# Patient Record
Sex: Male | Born: 1965
Health system: Southern US, Community
[De-identification: ages and names within clinical notes are randomized; demographics above are authoritative.]

## PROBLEM LIST (undated history)

## (undated) DIAGNOSIS — M109 Gout, unspecified: Secondary | ICD-10-CM

## (undated) HISTORY — PX: RHINOPLASTY: SUR1284

## (undated) HISTORY — DX: Gout, unspecified: M10.9

---

## 2006-04-03 ENCOUNTER — Encounter: Admission: RE | Admit: 2006-04-03 | Discharge: 2006-04-03 | Payer: Self-pay | Admitting: Emergency Medicine

## 2007-09-22 ENCOUNTER — Encounter: Admission: RE | Admit: 2007-09-22 | Discharge: 2007-09-22 | Payer: Self-pay | Admitting: Emergency Medicine

## 2008-12-27 ENCOUNTER — Ambulatory Visit: Payer: Self-pay | Admitting: Surgery

## 2011-03-06 NOTE — Assessment & Plan Note (Signed)
OFFICE VISIT   JOCELYN, LOWERY  DOB:  1966-08-16                                       12/27/2008  BJYNW#:[id number]   REASON FOR EVALUATION:  Rule out Raynaud's phenomenon.   HISTORY:  This is a 45 year old gentleman I am seeing at the request of  Dr. Georgina Pillion for evaluation of bilateral cold feet.  The patient has a  family history of peripheral vascular disease and has been complaining  with increasing severity over the past several months that his feet have  been becoming increasingly cold.  He denies any specific color changes.  He denies symptoms of claudication.  He has not had any ulceration.  There are no aggravating or relieving factors except for ambient  temperature.   The patient has a history of hypercholesterolemia; however, this is  managed without medications.  He has no other significant cardiovascular  history.  He does not smoke.   REVIEW OF SYSTEMS:  GENERAL:  Negative for fevers, chills, weight gain,  weight loss.  CARDIAC:  Negative.  PULMONARY:  Negative.  GI:  Negative.  GU:  Negative.  VASCULAR:  Please see above.  NEURO:  Negative.  ORTHO:  Negative.  PSYCH:  Negative.  ENT:  Negative.  HEME:  Negative.   PAST MEDICAL HISTORY:  1. Hypercholesterolemia.  2. Gout.  3. Adult ADD.  4. Erectile dysfunction.  5. Seasonal allergies.   PAST SURGICAL HISTORY:  1. Rhinoplasty.  2. Tonsillectomy.   SOCIAL HISTORY:  He chews tobacco 1/2 can times 20 years.  Alcohol is  occasional.   ALLERGIES:  None.   MEDICATIONS:  Allopurinol.   PHYSICAL EXAMINATION:  Vital Signs:  Blood pressure 134/80, pulse 60.  General:  He is well-appearing, in no distress.  Cardiovascular:  Regular rate and rhythm, respirations nonlabored.  Extremities:  Warm  and well-perfused.  Pedal pulses are palpable.  Neuro:  Nonfocal.  Psych:  He is alert and oriented.   DIAGNOSTIC STUDIES:  Ankle brachial indices were performed.  This  reveals triphasic  pedal waveforms and ankle brachial indices greater  than 1.   ASSESSMENT/PLAN:  Bilateral cold lower extremity.   Plan:  Patient does not have Raynaud's syndrome.  He has normal ankle  brachial indices.  There is no vascular etiology for his feet being  cold.   The patient does have a history of peripheral vascular disease in his  family and, therefore, we had a discussion of risk factor modification.  We discussed cessation of smokeless tobacco.  We also discussed  improving his cholesterol profile with an aim of getting his LDL levels  to less than 100, preferably less than 70.  He is not on a statin  currently, this could be added in the future should his cholesterol  profile worsen.  Again, I do not see a vascular etiology for his cold  feet.  He has normal arterial circulation in his feet.   Jorge Ny, MD  Electronically Signed   VWB/MEDQ  D:  12/27/2008  T:  12/28/2008  Job:  [id number]   cc:   Oley Balm. Georgina Pillion, M.D.

## 2013-09-24 ENCOUNTER — Other Ambulatory Visit: Payer: Self-pay | Admitting: Gastroenterology

## 2013-09-24 DIAGNOSIS — R195 Other fecal abnormalities: Secondary | ICD-10-CM

## 2013-09-24 DIAGNOSIS — R194 Change in bowel habit: Secondary | ICD-10-CM

## 2013-09-28 ENCOUNTER — Ambulatory Visit
Admission: RE | Admit: 2013-09-28 | Discharge: 2013-09-28 | Disposition: A | Discharge status: Home / Self Care | Payer: PRIVATE HEALTH INSURANCE | Source: Ambulatory Visit | Attending: Gastroenterology | Admitting: Gastroenterology

## 2013-09-28 DIAGNOSIS — R194 Change in bowel habit: Secondary | ICD-10-CM

## 2013-09-28 DIAGNOSIS — R195 Other fecal abnormalities: Secondary | ICD-10-CM

## 2013-09-28 MED ORDER — IOHEXOL 300 MG/ML  SOLN
25.0000 mL | Freq: Once | INTRAMUSCULAR | Status: AC | PRN
  Administered 2013-09-28: 25 mL via ORAL

## 2013-09-28 MED ORDER — IOHEXOL 300 MG/ML  SOLN
125.0000 mL | Freq: Once | INTRAMUSCULAR | Status: AC | PRN
  Administered 2013-09-28: 125 mL via INTRAVENOUS

## 2013-11-16 ENCOUNTER — Encounter (INDEPENDENT_AMBULATORY_CARE_PROVIDER_SITE_OTHER): Payer: Self-pay

## 2013-11-16 ENCOUNTER — Encounter (INDEPENDENT_AMBULATORY_CARE_PROVIDER_SITE_OTHER): Payer: Self-pay | Admitting: General Surgery

## 2013-11-16 ENCOUNTER — Ambulatory Visit (INDEPENDENT_AMBULATORY_CARE_PROVIDER_SITE_OTHER): Payer: 59 | Admitting: General Surgery

## 2013-11-16 VITALS — BP 128/88 | HR 80 | Resp 14 | Ht 68.0 in | Wt 205.8 lb

## 2013-11-16 DIAGNOSIS — K602 Anal fissure, unspecified: Secondary | ICD-10-CM | POA: Insufficient documentation

## 2013-11-16 NOTE — Progress Notes (Signed)
Patient ID: James Saunders, male   DOB: 1966/03/10, 48 y.o.   MRN: [id number]  Chief Complaint  Patient presents with  . perirectal abscess    HPI James Saunders is a 48 y.o. male.   HPI  He is referred by Dr. Loreta AveMann for further evaluation and treatment of a perirectal abscess. He has noted intermittent mucoid drainage from his anus since October. He sometimes feels swellings and then they go down. He denies any pain with bowel movements. He denies anal pain in general. He has some occasional bleeding. He underwent a colonoscopy recently which demonstrated some benign polyps. He denies any trauma to the anal area.  Past Medical History  Diagnosis Date  . Gout     Past Surgical History  Procedure Laterality Date  . Rhinoplasty      Family History  Problem Relation Age of Onset  . Kidney disease Mother     renal failure  . Heart disease Father     CHF  . Cancer Maternal Grandmother     breast cancer    Social History History  Substance Use Topics  . Smoking status: Current Every Day Smoker  . Smokeless tobacco: Not on file  . Alcohol Use: Yes     Comment: 5-6 weekly    Not on File  Current Outpatient Prescriptions  Medication Sig Dispense Refill  . ALLOPURINOL PO Take by mouth.       No current facility-administered medications for this visit.    Review of Systems Review of Systems  Constitutional: Negative.   Respiratory: Negative.   Cardiovascular: Negative.   Gastrointestinal: Positive for anal bleeding.  Genitourinary: Negative.   Musculoskeletal: Positive for arthralgias.    Blood pressure 128/88, pulse 80, resp. rate 14, height 5\' 8"  (1.727 m), weight 205 lb 12.8 oz (93.35 kg).  Physical Exam Physical Exam  Constitutional: No distress.  Overweight male.  HENT:  Head: Normocephalic and atraumatic.  Eyes: No scleral icterus.  Genitourinary:  Small nodular mass left perianal skin. Firm nodular masses posterior that are somewhat tender. Irregularity at the  6:00 position.  Some yellow, mucoid drainage is present.  Anoscopy was performed but it was very uncomfortable for him thus I could not visualize anything well.    Data Reviewed Notes from Dr. Loreta AveMann.  Assessment    His presentation is clinically suspicious for a anal fissure. However, he does not have much pain with bowel movements.     Plan    I recommended an examination under anesthesia and possible biopsy of some of the nodules to try and definitively diagnose the problem. We discussed the procedure the risks. Risks include but limited to bleeding, infection, wound problems, anesthesia. He seems to understand this and agrees with the plan.  He will call us back to schedule surgery.       Jordin Dambrosio J 11/16/2013, 12:00 PM

## 2013-11-16 NOTE — Patient Instructions (Signed)
Call [phone] to schedule your surgery.  Ask for a surgery scheduler.

## 2013-11-19 ENCOUNTER — Encounter (INDEPENDENT_AMBULATORY_CARE_PROVIDER_SITE_OTHER): Payer: Self-pay

## 2013-11-20 ENCOUNTER — Encounter (INDEPENDENT_AMBULATORY_CARE_PROVIDER_SITE_OTHER): Payer: Self-pay | Admitting: General Surgery

## 2013-11-25 ENCOUNTER — Telehealth (INDEPENDENT_AMBULATORY_CARE_PROVIDER_SITE_OTHER): Payer: Self-pay | Admitting: *Deleted

## 2013-11-25 NOTE — Telephone Encounter (Signed)
Patient called to state that he had received a message there were updates on his mychart however when he logged in there were no updates he could find.  I explained that I don't see a recent message or any updates from this end however [facility] Abbey Chattersosenbower to ask if there was something we needed to let him know.  Patient states understanding and agreeable at this time.

## 2013-11-27 ENCOUNTER — Other Ambulatory Visit (INDEPENDENT_AMBULATORY_CARE_PROVIDER_SITE_OTHER): Payer: Self-pay | Admitting: General Surgery

## 2013-11-30 ENCOUNTER — Telehealth (INDEPENDENT_AMBULATORY_CARE_PROVIDER_SITE_OTHER): Payer: Self-pay | Admitting: General Surgery

## 2013-11-30 NOTE — Telephone Encounter (Signed)
Noted  

## 2013-11-30 NOTE — Telephone Encounter (Signed)
Pt made aware of [facility] financial obligation will call back if decides to proceed

## 2015-01-23 IMAGING — CT CT ABD-PELV W/ CM
2 of 5 series · 17 of 46 positions shown, 19 images · IV contrast (RECTAL.READY&H20 & [ID] OMNI 300)
Comparison: None.

CLINICAL DATA: Pus and blood in stool; change in bowel habits

EXAM:
CT ABDOMEN AND PELVIS WITH CONTRAST
TECHNIQUE: Multidetector CT imaging of the abdomen and pelvis was performed
using the standard protocol following bolus administration of
intravenous contrast. Oral and rectal contrast were also
administered.
CONTRAST:  125mL OMNIPAQUE IOHEXOL 300 MG/ML  SOLN

[Series 2: abd/pelvis with · axial · 0.76mm/px · z∈[-532,-82]mm · 14 of 100 slices shown, 16 images]
[im 6/100  soft-tissue]
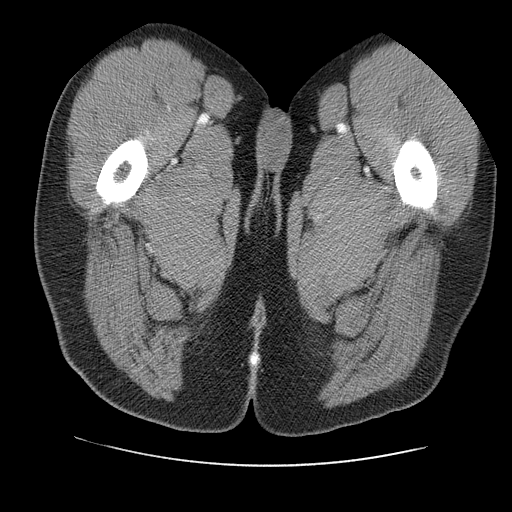
[im 6/100  bone]
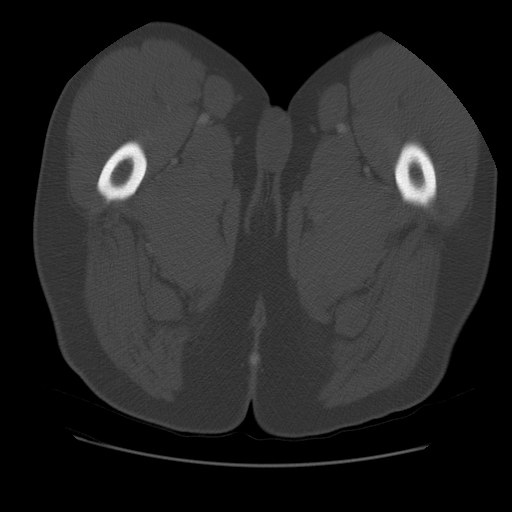
[im 12/100  soft-tissue]
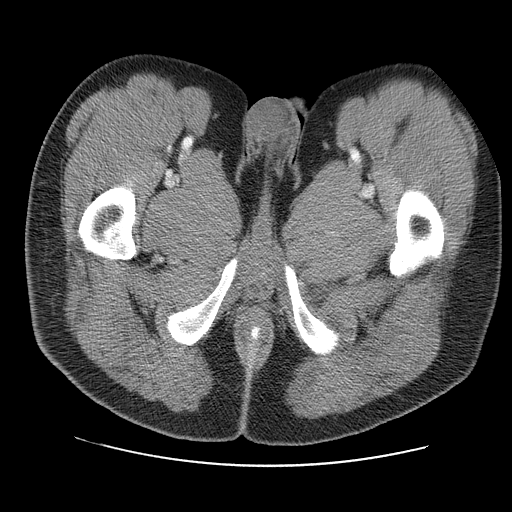
[im 23/100  soft-tissue]
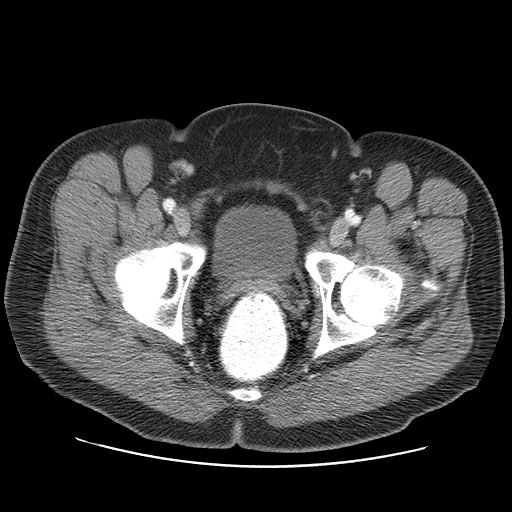
[im 28/100  soft-tissue]
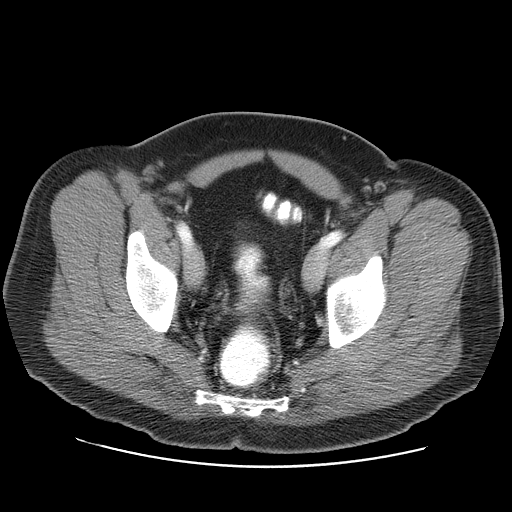
[im 34/100  soft-tissue]
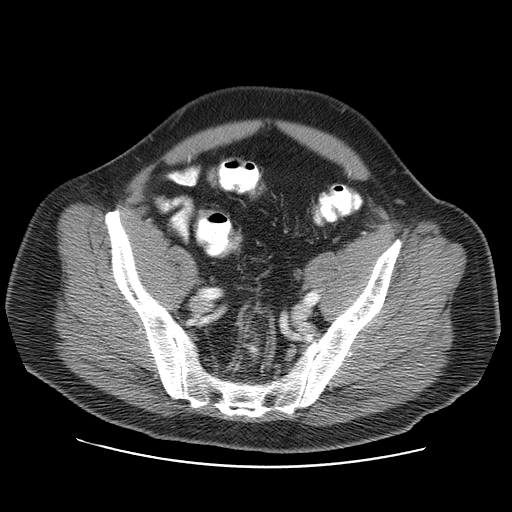
[im 39/100  soft-tissue]
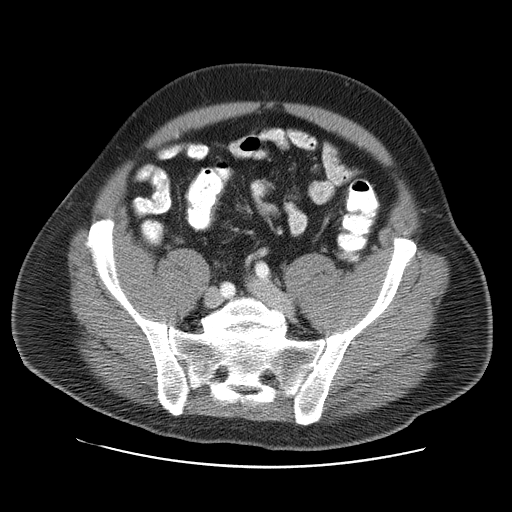
[im 45/100  soft-tissue]
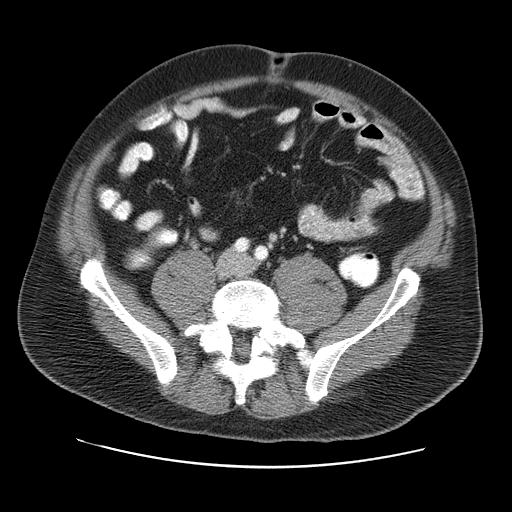
[im 56/100  soft-tissue]
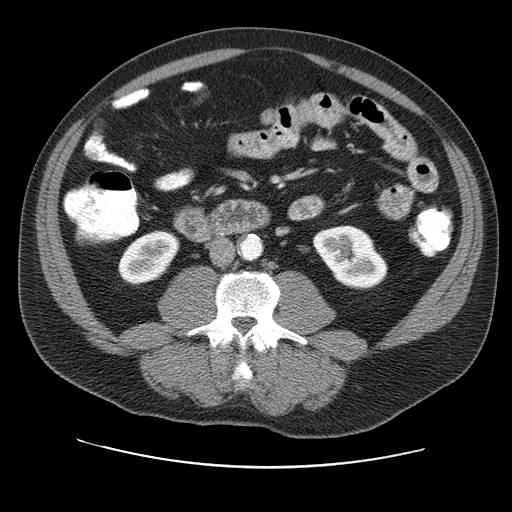
[im 61/100  soft-tissue]
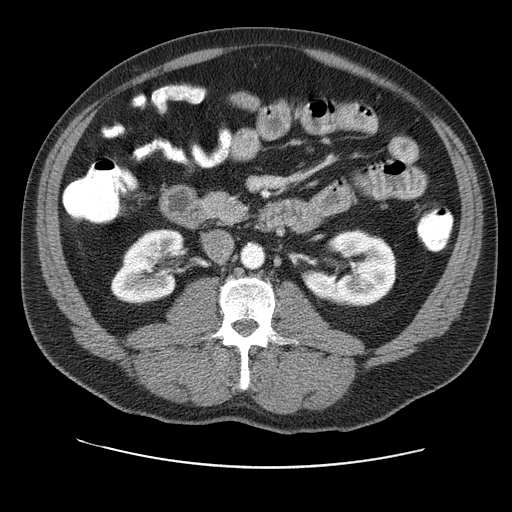
[im 61/100  bone]
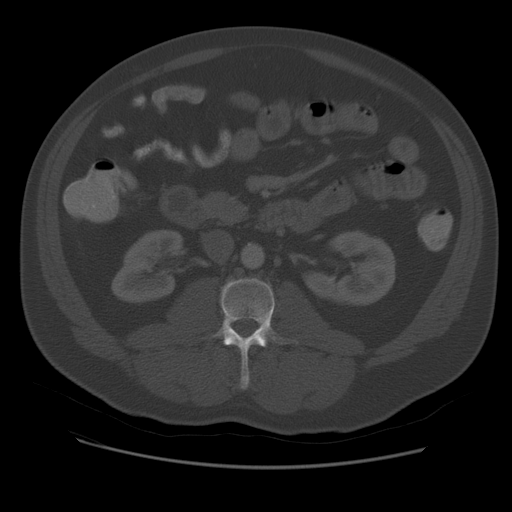
[im 67/100  soft-tissue]
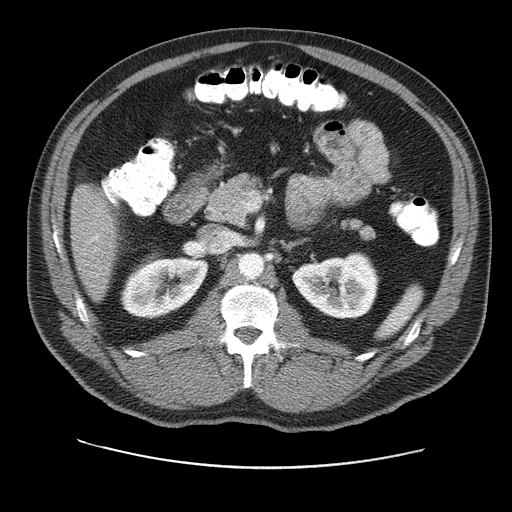
[im 72/100  soft-tissue]
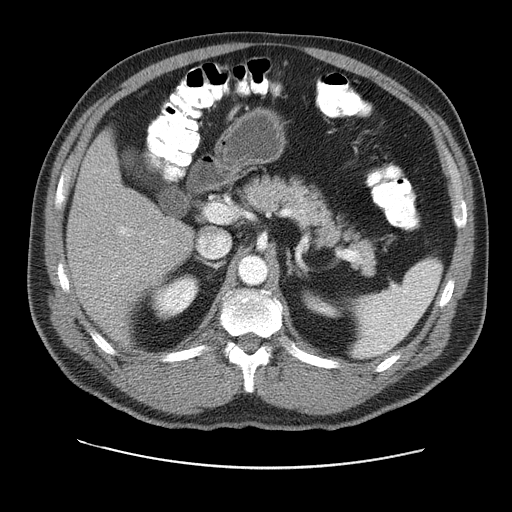
[im 78/100  soft-tissue]
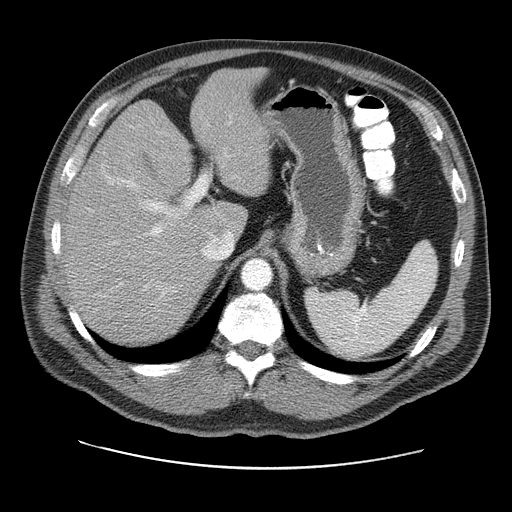
[im 89/100  soft-tissue]
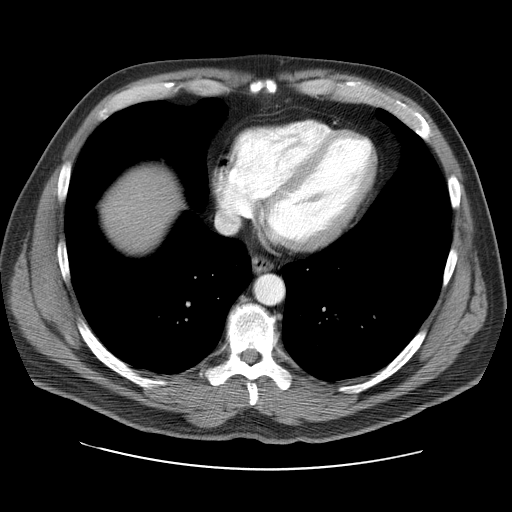
[im 94/100  soft-tissue]
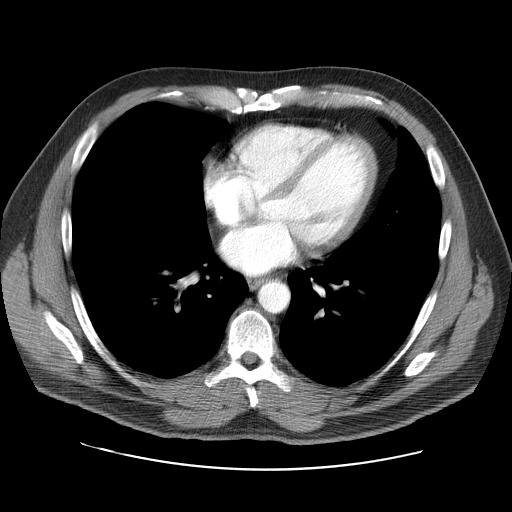

[Series 400: cor · coronal · 1.01mm/px · 3 of 159 slices shown]
[im 53/159  soft-tissue]
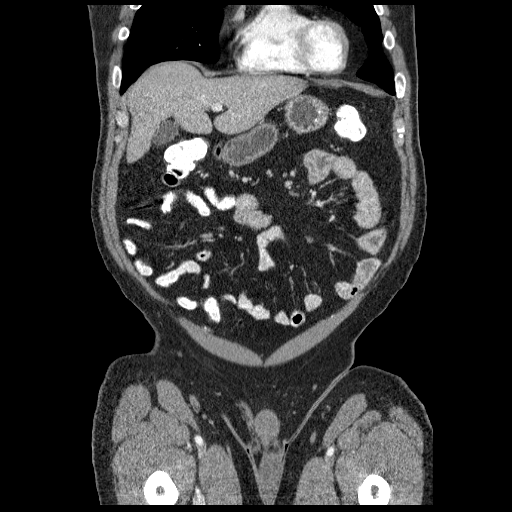
[im 71/159  soft-tissue]
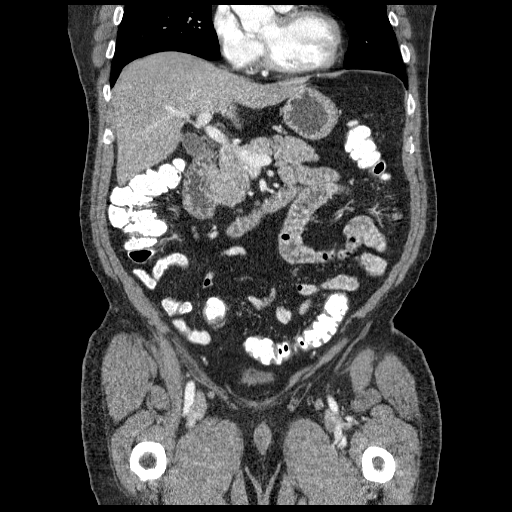
[im 88/159  soft-tissue]
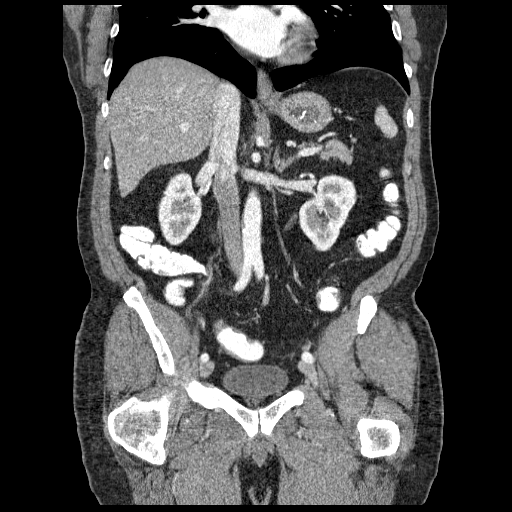

[17 of 46 positions shown; findings below may reference images not displayed]

FINDINGS: Lung bases are clear.

There is fatty change in the liver. No focal liver lesions are
identified. There is no biliary duct dilatation. Gallbladder wall is
not thickened.

Spleen, pancreas, and adrenals appear normal. Kidneys bilaterally
show fetal lobulation but no evidence of mass. There is no calculus
or hydronephrosis in either kidney. There is no apparent ureteral
calculus on either side.

In the pelvis, there is thickening of the wall of the proximal
rectum and much of the sigmoid colon consistent with a degree of
colitis. No abscess seen in this region. There is no evidence of
rectal or anal fistula or surrounding mesenteric thickening. There
is no pelvic mass or fluid collection. Appendix appears normal.

There is no bowel obstruction. No free air or portal venous air.
There is no appreciable ascites, adenopathy, or abscess in the
abdomen or pelvis. There is atherosclerotic change in the aorta but
no aneurysm. There are pars defects at L5 bilaterally with 8 mm of
anterolisthesis of L5 on S1. There is disc narrowing at L4-5 and
L5-S1. There are no blastic or lytic bone lesions.
IMPRESSION: There is thickening of the wall of much of the sigmoid colon and
proximal rectum consistent with a degree of colitis and proximal
proctitis. There is no abscess or fistula. The surrounding mesentery
appears normal.

No bowel obstruction. Elsewhere, no abscess or mesenteric
thickening. Appendix appears normal.

Fatty liver.

## 2018-08-22 DIAGNOSIS — Z23 Encounter for immunization: Secondary | ICD-10-CM | POA: Diagnosis not present

## 2018-08-22 DIAGNOSIS — Z0000 Encounter for general adult medical examination without abnormal findings: Secondary | ICD-10-CM | POA: Diagnosis not present

## 2018-08-22 DIAGNOSIS — Z125 Encounter for screening for malignant neoplasm of prostate: Secondary | ICD-10-CM | POA: Diagnosis not present

## 2018-08-22 DIAGNOSIS — E785 Hyperlipidemia, unspecified: Secondary | ICD-10-CM | POA: Diagnosis not present

## 2018-09-23 DIAGNOSIS — J019 Acute sinusitis, unspecified: Secondary | ICD-10-CM | POA: Diagnosis not present

## 2019-07-07 DIAGNOSIS — D229 Melanocytic nevi, unspecified: Secondary | ICD-10-CM | POA: Diagnosis not present

## 2019-07-07 DIAGNOSIS — L918 Other hypertrophic disorders of the skin: Secondary | ICD-10-CM | POA: Diagnosis not present

## 2019-07-07 DIAGNOSIS — L570 Actinic keratosis: Secondary | ICD-10-CM | POA: Diagnosis not present

## 2019-07-28 DIAGNOSIS — Z23 Encounter for immunization: Secondary | ICD-10-CM | POA: Diagnosis not present

## 2019-09-14 DIAGNOSIS — Z0000 Encounter for general adult medical examination without abnormal findings: Secondary | ICD-10-CM | POA: Diagnosis not present

## 2019-10-07 DIAGNOSIS — E785 Hyperlipidemia, unspecified: Secondary | ICD-10-CM | POA: Diagnosis not present

## 2019-10-07 DIAGNOSIS — Z125 Encounter for screening for malignant neoplasm of prostate: Secondary | ICD-10-CM | POA: Diagnosis not present

## 2019-10-07 DIAGNOSIS — M109 Gout, unspecified: Secondary | ICD-10-CM | POA: Diagnosis not present

## 2019-10-13 DIAGNOSIS — K5730 Diverticulosis of large intestine without perforation or abscess without bleeding: Secondary | ICD-10-CM | POA: Diagnosis not present

## 2019-10-13 DIAGNOSIS — Z1211 Encounter for screening for malignant neoplasm of colon: Secondary | ICD-10-CM | POA: Diagnosis not present

## 2019-10-13 DIAGNOSIS — Z86010 Personal history of colonic polyps: Secondary | ICD-10-CM | POA: Diagnosis not present

## 2019-10-20 DIAGNOSIS — Z23 Encounter for immunization: Secondary | ICD-10-CM | POA: Diagnosis not present

## 2019-10-28 DIAGNOSIS — Z1211 Encounter for screening for malignant neoplasm of colon: Secondary | ICD-10-CM | POA: Diagnosis not present

## 2019-10-28 DIAGNOSIS — D124 Benign neoplasm of descending colon: Secondary | ICD-10-CM | POA: Diagnosis not present

## 2019-10-28 DIAGNOSIS — K635 Polyp of colon: Secondary | ICD-10-CM | POA: Diagnosis not present

## 2019-10-28 DIAGNOSIS — D125 Benign neoplasm of sigmoid colon: Secondary | ICD-10-CM | POA: Diagnosis not present

## 2019-12-22 DIAGNOSIS — Z23 Encounter for immunization: Secondary | ICD-10-CM | POA: Diagnosis not present

## 2020-01-15 ENCOUNTER — Ambulatory Visit: Payer: PRIVATE HEALTH INSURANCE | Attending: Internal Medicine

## 2020-01-15 DIAGNOSIS — Z23 Encounter for immunization: Secondary | ICD-10-CM

## 2020-01-15 NOTE — Progress Notes (Signed)
   Covid-19 Vaccination Clinic  Name:  James Saunders    MRN: [id number] DOB: 10/06/1966  01/15/2020  James Saunders was observed post Covid-19 immunization for 15 minutes without incident. He was provided with Vaccine Information Sheet and instruction to access the V-Safe system.   James Saunders was instructed to call 911 with any severe reactions post vaccine: Marland Kitchen Difficulty breathing  . Swelling of face and throat  . A fast heartbeat  . A bad rash all over body  . Dizziness and weakness   Immunizations Administered    Name Date Dose VIS Date Route   Pfizer COVID-19 Vaccine 01/15/2020 10:08 AM 0.3 mL 10/02/2019 Intramuscular   Manufacturer: [employer], [employer]   Lot: [id number]   NDC: [address]-2

## 2020-02-08 ENCOUNTER — Ambulatory Visit: Payer: PRIVATE HEALTH INSURANCE | Attending: Internal Medicine

## 2020-02-08 DIAGNOSIS — Z23 Encounter for immunization: Secondary | ICD-10-CM

## 2020-02-08 NOTE — Progress Notes (Signed)
   Covid-19 Vaccination Clinic  Name:  Marrio Scribner    MRN: [id number] DOB: [birth year]  02/08/2020  Mr. Meriwether was observed post Covid-19 immunization for 15 minutes without incident. He was provided with Vaccine Information Sheet and instruction to access the V-Safe system.   Mr. Michelle was instructed to call 911 with any severe reactions post vaccine: Marland Kitchen Difficulty breathing  . Swelling of face and throat  . A fast heartbeat  . A bad rash all over body  . Dizziness and weakness   Immunizations Administered    Name Date Dose VIS Date Route   Pfizer COVID-19 Vaccine 02/08/2020  2:49 PM 0.3 mL 12/16/2018 Intramuscular   Manufacturer: [employer], [employer]   Lot: [id number]   NDC: [address]-5

## 2020-10-19 DIAGNOSIS — H0288B Meibomian gland dysfunction left eye, upper and lower eyelids: Secondary | ICD-10-CM | POA: Diagnosis not present

## 2020-10-19 DIAGNOSIS — H0288A Meibomian gland dysfunction right eye, upper and lower eyelids: Secondary | ICD-10-CM | POA: Diagnosis not present

## 2020-10-19 DIAGNOSIS — H2513 Age-related nuclear cataract, bilateral: Secondary | ICD-10-CM | POA: Diagnosis not present

## 2020-10-28 DIAGNOSIS — Z13220 Encounter for screening for lipoid disorders: Secondary | ICD-10-CM | POA: Diagnosis not present

## 2020-10-28 DIAGNOSIS — E785 Hyperlipidemia, unspecified: Secondary | ICD-10-CM | POA: Diagnosis not present

## 2020-10-28 DIAGNOSIS — Z0000 Encounter for general adult medical examination without abnormal findings: Secondary | ICD-10-CM | POA: Diagnosis not present

## 2020-10-28 DIAGNOSIS — Z125 Encounter for screening for malignant neoplasm of prostate: Secondary | ICD-10-CM | POA: Diagnosis not present

## 2020-10-28 DIAGNOSIS — M109 Gout, unspecified: Secondary | ICD-10-CM | POA: Diagnosis not present

## 2021-05-16 ENCOUNTER — Other Ambulatory Visit: Payer: Self-pay

## 2021-05-16 ENCOUNTER — Encounter: Payer: Self-pay | Admitting: Dermatology

## 2021-05-16 ENCOUNTER — Ambulatory Visit (INDEPENDENT_AMBULATORY_CARE_PROVIDER_SITE_OTHER): Payer: BC Managed Care – PPO | Admitting: Dermatology

## 2021-05-16 DIAGNOSIS — L309 Dermatitis, unspecified: Secondary | ICD-10-CM | POA: Diagnosis not present

## 2021-05-16 DIAGNOSIS — L918 Other hypertrophic disorders of the skin: Secondary | ICD-10-CM

## 2021-05-16 DIAGNOSIS — L570 Actinic keratosis: Secondary | ICD-10-CM

## 2021-05-16 MED ORDER — CLOBETASOL PROP EMOLLIENT BASE 0.05 % EX CREA: TOPICAL_CREAM | CUTANEOUS | 1 refills | Status: AC

## 2021-06-01 ENCOUNTER — Encounter: Payer: Self-pay | Admitting: Dermatology

## 2021-06-01 NOTE — Progress Notes (Signed)
   Follow-Up Visit   Subjective  James Saunders is a 55 y.o. male who presents for the following: Annual Exam (Right lower legs x 2 weeks no itch raised pink area, tan isk on left forearm and left lower leg. Skin tags waist line under arms ).  Annual exam, several areas to check. Location:  Duration:  Quality:  Associated Signs/Symptoms: Modifying Factors:  Severity:  Timing: Context:   Objective  Well appearing patient in no apparent distress; mood and affect are within normal limits. Right Knee - Anterior Micropapular dermatitis right outer knee, etiology uncertain.  Left Forearm - Anterior Multiple small gritty crusts +1 more hornlike lesion left proximal forearm  Left Axilla, Left Inguinal Area, Right Axilla Multiple pedunculated 2 to 3 mm flesh-colored papules    A full examination was performed including scalp, head, eyes, ears, nose, lips, neck, chest, axillae, abdomen, back, buttocks, bilateral upper extremities, bilateral lower extremities, hands, feet, fingers, toes, fingernails, and toenails. All findings within normal limits unless otherwise noted below.   Assessment & Plan    Dermatitis Right Knee - Anterior  Once daily application of clobetasol after bathing for 3 weeks; follow-up by MyChart at that time.  Clobetasol Prop Emollient Base 0.05 % emollient cream - Right Knee - Anterior Apply topically 1-2 (two) times a day if needed (Rash).  AK (actinic keratosis) Left Forearm - Anterior  We will do topical [place], 28 applications apply once daily, in the late fall or winter.  Liquid nitrogen freeze to thick lesion  Destruction of lesion - Left Forearm - Anterior Complexity: simple   Destruction method: cryotherapy   Informed consent: discussed and consent obtained   Timeout:  patient name, date of birth, surgical site, and procedure verified Lesion destroyed using liquid nitrogen: Yes   Cryotherapy cycles:  3 Outcome: patient tolerated procedure well  with no complications   Post-procedure details: wound care instructions given    Skin tag (3) Left Axilla; Right Axilla; Left Inguinal Area  Patient may schedule to have these removed if he so chooses.      I, Janalyn Harder, MD, have reviewed all documentation for this visit.  The documentation on 06/01/21 for the exam, diagnosis, procedures, and orders are all accurate and complete.

## 2021-09-04 ENCOUNTER — Ambulatory Visit: Payer: BC Managed Care – PPO | Admitting: Dermatology

## 2021-11-14 DIAGNOSIS — N289 Disorder of kidney and ureter, unspecified: Secondary | ICD-10-CM | POA: Diagnosis not present

## 2021-11-14 DIAGNOSIS — Z0000 Encounter for general adult medical examination without abnormal findings: Secondary | ICD-10-CM | POA: Diagnosis not present

## 2021-11-14 DIAGNOSIS — E785 Hyperlipidemia, unspecified: Secondary | ICD-10-CM | POA: Diagnosis not present

## 2021-11-14 DIAGNOSIS — Z13220 Encounter for screening for lipoid disorders: Secondary | ICD-10-CM | POA: Diagnosis not present

## 2021-11-14 DIAGNOSIS — R7309 Other abnormal glucose: Secondary | ICD-10-CM | POA: Diagnosis not present

## 2021-11-14 DIAGNOSIS — M109 Gout, unspecified: Secondary | ICD-10-CM | POA: Diagnosis not present

## 2021-11-14 DIAGNOSIS — Z125 Encounter for screening for malignant neoplasm of prostate: Secondary | ICD-10-CM | POA: Diagnosis not present

## 2021-11-22 DIAGNOSIS — E1169 Type 2 diabetes mellitus with other specified complication: Secondary | ICD-10-CM | POA: Diagnosis not present

## 2022-03-02 DIAGNOSIS — M109 Gout, unspecified: Secondary | ICD-10-CM | POA: Diagnosis not present

## 2022-03-02 DIAGNOSIS — E1169 Type 2 diabetes mellitus with other specified complication: Secondary | ICD-10-CM | POA: Diagnosis not present

## 2022-03-02 DIAGNOSIS — E669 Obesity, unspecified: Secondary | ICD-10-CM | POA: Diagnosis not present

## 2022-03-02 DIAGNOSIS — E785 Hyperlipidemia, unspecified: Secondary | ICD-10-CM | POA: Diagnosis not present

## 2022-07-20 DIAGNOSIS — Z23 Encounter for immunization: Secondary | ICD-10-CM | POA: Diagnosis not present

## 2022-07-20 DIAGNOSIS — E1169 Type 2 diabetes mellitus with other specified complication: Secondary | ICD-10-CM | POA: Diagnosis not present

## 2022-07-20 DIAGNOSIS — M109 Gout, unspecified: Secondary | ICD-10-CM | POA: Diagnosis not present

## 2022-07-20 DIAGNOSIS — E785 Hyperlipidemia, unspecified: Secondary | ICD-10-CM | POA: Diagnosis not present

## 2022-11-22 DIAGNOSIS — Z0000 Encounter for general adult medical examination without abnormal findings: Secondary | ICD-10-CM | POA: Diagnosis not present

## 2022-11-22 DIAGNOSIS — E1169 Type 2 diabetes mellitus with other specified complication: Secondary | ICD-10-CM | POA: Diagnosis not present

## 2022-11-22 DIAGNOSIS — E785 Hyperlipidemia, unspecified: Secondary | ICD-10-CM | POA: Diagnosis not present

## 2022-11-22 DIAGNOSIS — Z125 Encounter for screening for malignant neoplasm of prostate: Secondary | ICD-10-CM | POA: Diagnosis not present

## 2022-11-29 DIAGNOSIS — K760 Fatty (change of) liver, not elsewhere classified: Secondary | ICD-10-CM | POA: Diagnosis not present

## 2022-11-29 DIAGNOSIS — Z1211 Encounter for screening for malignant neoplasm of colon: Secondary | ICD-10-CM | POA: Diagnosis not present

## 2022-11-29 DIAGNOSIS — K5730 Diverticulosis of large intestine without perforation or abscess without bleeding: Secondary | ICD-10-CM | POA: Diagnosis not present

## 2022-11-29 DIAGNOSIS — Z86010 Personal history of colonic polyps: Secondary | ICD-10-CM | POA: Diagnosis not present

## 2022-12-03 DIAGNOSIS — K5730 Diverticulosis of large intestine without perforation or abscess without bleeding: Secondary | ICD-10-CM | POA: Diagnosis not present

## 2022-12-03 DIAGNOSIS — E782 Mixed hyperlipidemia: Secondary | ICD-10-CM | POA: Diagnosis not present

## 2022-12-03 DIAGNOSIS — I10 Essential (primary) hypertension: Secondary | ICD-10-CM | POA: Diagnosis not present

## 2022-12-03 DIAGNOSIS — K760 Fatty (change of) liver, not elsewhere classified: Secondary | ICD-10-CM | POA: Diagnosis not present

## 2022-12-05 DIAGNOSIS — D509 Iron deficiency anemia, unspecified: Secondary | ICD-10-CM | POA: Diagnosis not present

## 2022-12-05 DIAGNOSIS — K760 Fatty (change of) liver, not elsewhere classified: Secondary | ICD-10-CM | POA: Diagnosis not present

## 2023-02-08 DIAGNOSIS — D123 Benign neoplasm of transverse colon: Secondary | ICD-10-CM | POA: Diagnosis not present

## 2023-02-08 DIAGNOSIS — K5730 Diverticulosis of large intestine without perforation or abscess without bleeding: Secondary | ICD-10-CM | POA: Diagnosis not present

## 2023-02-08 DIAGNOSIS — Z1211 Encounter for screening for malignant neoplasm of colon: Secondary | ICD-10-CM | POA: Diagnosis not present

## 2023-02-08 DIAGNOSIS — K635 Polyp of colon: Secondary | ICD-10-CM | POA: Diagnosis not present

## 2023-03-26 DIAGNOSIS — Z03818 Encounter for observation for suspected exposure to other biological agents ruled out: Secondary | ICD-10-CM | POA: Diagnosis not present

## 2023-03-26 DIAGNOSIS — R059 Cough, unspecified: Secondary | ICD-10-CM | POA: Diagnosis not present

## 2023-03-26 DIAGNOSIS — J988 Other specified respiratory disorders: Secondary | ICD-10-CM | POA: Diagnosis not present

## 2023-03-26 DIAGNOSIS — M79642 Pain in left hand: Secondary | ICD-10-CM | POA: Diagnosis not present

## 2023-05-24 DIAGNOSIS — E785 Hyperlipidemia, unspecified: Secondary | ICD-10-CM | POA: Diagnosis not present

## 2023-05-24 DIAGNOSIS — E1169 Type 2 diabetes mellitus with other specified complication: Secondary | ICD-10-CM | POA: Diagnosis not present

## 2023-05-24 DIAGNOSIS — F900 Attention-deficit hyperactivity disorder, predominantly inattentive type: Secondary | ICD-10-CM | POA: Diagnosis not present

## 2023-05-29 DIAGNOSIS — E1169 Type 2 diabetes mellitus with other specified complication: Secondary | ICD-10-CM | POA: Diagnosis not present

## 2023-05-29 DIAGNOSIS — E785 Hyperlipidemia, unspecified: Secondary | ICD-10-CM | POA: Diagnosis not present

## 2023-09-11 ENCOUNTER — Encounter: Payer: Self-pay | Admitting: Podiatry

## 2023-09-11 ENCOUNTER — Ambulatory Visit (INDEPENDENT_AMBULATORY_CARE_PROVIDER_SITE_OTHER): Payer: BC Managed Care – PPO | Admitting: Podiatry

## 2023-09-11 DIAGNOSIS — B351 Tinea unguium: Secondary | ICD-10-CM

## 2023-09-11 DIAGNOSIS — L600 Ingrowing nail: Secondary | ICD-10-CM

## 2023-09-11 NOTE — Progress Notes (Signed)
Subjective:   Patient ID: James Saunders, male   DOB: 57 y.o.   MRN: [id number]   HPI Patient presents stating he has a lot of discoloration and thickness in the big toenails of both feet and he is concerned about this discoloration pattern been present 20 years has had previous antifungal therapy terbinafine but it has been at least 5 years.  Patient does not smoke and likes to be active   Review of Systems  All other systems reviewed and are negative.       Objective:  Physical Exam Vitals and nursing note reviewed.  Constitutional:      Appearance: He is well-developed.  Pulmonary:     Effort: Pulmonary effort is normal.  Musculoskeletal:        General: Normal range of motion.  Skin:    General: Skin is warm.  Neurological:     Mental Status: He is alert.     Neurovascular status intact muscle strength found to be adequate range of motion adequate with patient found to have discoloration big toenails of both feet that are thick and dystrophic nonpainful currently and have been present for a long period of time.  Good digital perfusion well-oriented     Assessment:  Probability that this is a fungal nail component but also trauma     Plan:  H&P spent a great deal of time with him trying to educate him on this causes and treatment options and at this point I have recommended keeping them smooth grind did we will consider more aggressive treatment in the future and I also discussed the possibility long-term for permanent removal educating him on procedure and recovery

## 2023-12-09 DIAGNOSIS — Z23 Encounter for immunization: Secondary | ICD-10-CM | POA: Diagnosis not present

## 2023-12-09 DIAGNOSIS — E785 Hyperlipidemia, unspecified: Secondary | ICD-10-CM | POA: Diagnosis not present

## 2023-12-09 DIAGNOSIS — R030 Elevated blood-pressure reading, without diagnosis of hypertension: Secondary | ICD-10-CM | POA: Diagnosis not present

## 2023-12-09 DIAGNOSIS — Z125 Encounter for screening for malignant neoplasm of prostate: Secondary | ICD-10-CM | POA: Diagnosis not present

## 2023-12-09 DIAGNOSIS — E1169 Type 2 diabetes mellitus with other specified complication: Secondary | ICD-10-CM | POA: Diagnosis not present

## 2023-12-09 DIAGNOSIS — M109 Gout, unspecified: Secondary | ICD-10-CM | POA: Diagnosis not present

## 2023-12-09 DIAGNOSIS — Z0000 Encounter for general adult medical examination without abnormal findings: Secondary | ICD-10-CM | POA: Diagnosis not present

## 2024-02-05 DIAGNOSIS — I10 Essential (primary) hypertension: Secondary | ICD-10-CM | POA: Diagnosis not present

## 2024-04-10 DIAGNOSIS — E1169 Type 2 diabetes mellitus with other specified complication: Secondary | ICD-10-CM | POA: Diagnosis not present

## 2024-04-10 DIAGNOSIS — G4733 Obstructive sleep apnea (adult) (pediatric): Secondary | ICD-10-CM | POA: Diagnosis not present

## 2024-04-10 DIAGNOSIS — F900 Attention-deficit hyperactivity disorder, predominantly inattentive type: Secondary | ICD-10-CM | POA: Diagnosis not present

## 2024-04-10 DIAGNOSIS — E785 Hyperlipidemia, unspecified: Secondary | ICD-10-CM | POA: Diagnosis not present

## 2024-04-21 DIAGNOSIS — H52203 Unspecified astigmatism, bilateral: Secondary | ICD-10-CM | POA: Diagnosis not present

## 2024-04-21 DIAGNOSIS — H47323 Drusen of optic disc, bilateral: Secondary | ICD-10-CM | POA: Diagnosis not present

## 2024-04-21 DIAGNOSIS — H2513 Age-related nuclear cataract, bilateral: Secondary | ICD-10-CM | POA: Diagnosis not present

## 2024-04-21 DIAGNOSIS — E119 Type 2 diabetes mellitus without complications: Secondary | ICD-10-CM | POA: Diagnosis not present
